# Patient Record
Sex: Female | Born: 1985 | Race: White | Hispanic: No | Marital: Single | Smoking: Never smoker
Health system: Southern US, Community
[De-identification: ages and names within clinical notes are randomized; demographics above are authoritative.]

## PROBLEM LIST (undated history)

## (undated) DIAGNOSIS — U071 COVID-19: Secondary | ICD-10-CM

## (undated) DIAGNOSIS — F909 Attention-deficit hyperactivity disorder, unspecified type: Secondary | ICD-10-CM

## (undated) DIAGNOSIS — R519 Headache, unspecified: Secondary | ICD-10-CM

## (undated) DIAGNOSIS — F419 Anxiety disorder, unspecified: Secondary | ICD-10-CM

## (undated) DIAGNOSIS — Z87442 Personal history of urinary calculi: Secondary | ICD-10-CM

## (undated) DIAGNOSIS — I10 Essential (primary) hypertension: Secondary | ICD-10-CM

## (undated) DIAGNOSIS — E063 Autoimmune thyroiditis: Secondary | ICD-10-CM

## (undated) DIAGNOSIS — F32A Depression, unspecified: Secondary | ICD-10-CM

## (undated) HISTORY — PX: BUNIONECTOMY: SHX129

## (undated) HISTORY — PX: OTHER SURGICAL HISTORY: SHX169

## (undated) HISTORY — PX: LAPAROTOMY: SHX154

## (undated) HISTORY — PX: OOPHORECTOMY: SHX86

---

## 2005-01-15 ENCOUNTER — Emergency Department (HOSPITAL_COMMUNITY): Admission: EM | Admit: 2005-01-15 | Discharge: 2005-01-16 | Payer: Self-pay | Admitting: Emergency Medicine

## 2006-03-16 ENCOUNTER — Ambulatory Visit: Payer: Self-pay | Admitting: Hematology & Oncology

## 2006-03-31 LAB — CBC WITH DIFFERENTIAL/PLATELET
Basophils Absolute: 0 10*3/uL (ref 0.0–0.1)
Eosinophils Absolute: 0.1 10*3/uL (ref 0.0–0.5)
HGB: 12.6 g/dL (ref 11.6–15.9)
MCV: 86.8 fL (ref 81.0–101.0)
MONO#: 0.4 10*3/uL (ref 0.1–0.9)
MONO%: 8 % (ref 0.0–13.0)
NEUT#: 1.7 10*3/uL (ref 1.5–6.5)
RBC: 4.22 10*6/uL (ref 3.70–5.32)
RDW: 13.8 % (ref 11.3–14.5)
WBC: 4.6 10*3/uL (ref 3.9–10.0)
lymph#: 2.5 10*3/uL (ref 0.9–3.3)

## 2006-03-31 LAB — CHCC SMEAR

## 2006-03-31 LAB — IVY BLEEDING TIME: Bleeding Time: 9 Minutes (ref 2.0–8.0)

## 2006-04-03 LAB — VON WILLEBRAND PANEL
Factor-VIII Activity: 68 % — ABNORMAL LOW (ref 75–150)
Ristocetin-Cofactor: 49 % — ABNORMAL LOW (ref 50–150)

## 2006-04-03 LAB — APTT: aPTT: 30 seconds (ref 24–37)

## 2006-04-04 ENCOUNTER — Encounter: Admission: RE | Admit: 2006-04-04 | Discharge: 2006-04-04 | Payer: Self-pay | Admitting: Hematology & Oncology

## 2006-04-26 LAB — IVY BLEEDING TIME: Bleeding Time: 8.5 Minutes — ABNORMAL HIGH (ref 2.0–8.0)

## 2006-05-03 LAB — PROTHROMBIN TIME
INR: 1 (ref 0.0–1.5)
Prothrombin Time: 12.8 seconds (ref 11.6–15.2)

## 2006-05-03 LAB — VON WILLEBRAND FACTOR MULTIMER

## 2010-07-11 ENCOUNTER — Emergency Department (HOSPITAL_COMMUNITY): Admission: EM | Admit: 2010-07-11 | Discharge: 2010-07-11 | Payer: Self-pay | Admitting: Emergency Medicine

## 2010-07-12 ENCOUNTER — Inpatient Hospital Stay (HOSPITAL_COMMUNITY): Admission: AD | Admit: 2010-07-12 | Discharge: 2010-07-12 | Payer: Self-pay | Admitting: Family Medicine

## 2010-07-12 ENCOUNTER — Ambulatory Visit: Payer: Self-pay | Admitting: Obstetrics and Gynecology

## 2010-12-01 LAB — DIFFERENTIAL
Basophils Absolute: 0 10*3/uL (ref 0.0–0.1)
Basophils Absolute: 0 10*3/uL (ref 0.0–0.1)
Basophils Relative: 0 % (ref 0–1)
Eosinophils Absolute: 0 10*3/uL (ref 0.0–0.7)
Eosinophils Absolute: 0.1 10*3/uL (ref 0.0–0.7)
Eosinophils Relative: 1 % (ref 0–5)
Eosinophils Relative: 3 % (ref 0–5)
Lymphocytes Relative: 53 % — ABNORMAL HIGH (ref 12–46)
Lymphs Abs: 2.3 10*3/uL (ref 0.7–4.0)
Monocytes Absolute: 0.3 10*3/uL (ref 0.1–1.0)
Monocytes Relative: 7 % (ref 3–12)
Neutrophils Relative %: 35 % — ABNORMAL LOW (ref 43–77)

## 2010-12-01 LAB — COMPREHENSIVE METABOLIC PANEL
ALT: 14 U/L (ref 0–35)
AST: 19 U/L (ref 0–37)
Albumin: 4.3 g/dL (ref 3.5–5.2)
Alkaline Phosphatase: 54 U/L (ref 39–117)
BUN: 10 mg/dL (ref 6–23)
Chloride: 109 mEq/L (ref 96–112)
GFR calc Af Amer: >60 mL/min (ref >60)
Potassium: 4.2 mEq/L (ref 3.5–5.1)
Sodium: 142 mEq/L (ref 135–145)
Total Bilirubin: 0.8 mg/dL (ref 0.3–1.2)
Total Protein: 6.9 g/dL (ref 6.0–8.3)

## 2010-12-01 LAB — URINALYSIS, ROUTINE W REFLEX MICROSCOPIC
Glucose, UA: NEGATIVE mg/dL
Ketones, ur: NEGATIVE mg/dL
Leukocytes, UA: NEGATIVE
Nitrite: NEGATIVE
Protein, ur: NEGATIVE mg/dL

## 2010-12-01 LAB — CBC
MCV: 91.1 fL (ref 78.0–100.0)
MCV: 92.1 fL (ref 78.0–100.0)
Platelets: 187 10*3/uL (ref 150–400)
Platelets: 209 10*3/uL (ref 150–400)
RBC: 4.07 MIL/uL (ref 3.87–5.11)
RBC: 4.23 MIL/uL (ref 3.87–5.11)
RDW: 12.6 % (ref 11.5–15.5)
WBC: 4.4 10*3/uL (ref 4.0–10.5)
WBC: 4.7 10*3/uL (ref 4.0–10.5)

## 2010-12-01 LAB — URINE CULTURE
Colony Count: NO GROWTH
Culture  Setup Time: 201110250158
Culture: NO GROWTH

## 2010-12-01 LAB — WET PREP, GENITAL
Trich, Wet Prep: NONE SEEN
Yeast Wet Prep HPF POC: NONE SEEN

## 2010-12-01 LAB — URINE MICROSCOPIC-ADD ON

## 2011-07-05 ENCOUNTER — Other Ambulatory Visit (HOSPITAL_COMMUNITY): Payer: Self-pay | Admitting: Endocrinology

## 2011-07-05 DIAGNOSIS — E0590 Thyrotoxicosis, unspecified without thyrotoxic crisis or storm: Secondary | ICD-10-CM

## 2011-07-20 ENCOUNTER — Encounter (HOSPITAL_COMMUNITY)
Admission: RE | Admit: 2011-07-20 | Discharge: 2011-07-20 | Disposition: A | Discharge status: Home / Self Care | Payer: 59 | Source: Ambulatory Visit | Attending: Endocrinology | Admitting: Endocrinology

## 2011-07-20 DIAGNOSIS — E0590 Thyrotoxicosis, unspecified without thyrotoxic crisis or storm: Secondary | ICD-10-CM | POA: Insufficient documentation

## 2011-07-21 ENCOUNTER — Encounter (HOSPITAL_COMMUNITY): Payer: Self-pay

## 2011-07-21 ENCOUNTER — Encounter (HOSPITAL_COMMUNITY)
Admission: RE | Admit: 2011-07-21 | Discharge: 2011-07-21 | Disposition: A | Discharge status: Home / Self Care | Payer: 59 | Source: Ambulatory Visit | Attending: Endocrinology | Admitting: Endocrinology

## 2011-07-21 MED ORDER — SODIUM IODIDE I 131 CAPSULE
6.5000 | Freq: Once | INTRAVENOUS | Status: AC | PRN
  Administered 2011-07-20: 6.5 via ORAL

## 2011-07-21 MED ORDER — SODIUM PERTECHNETATE TC 99M INJECTION
10.0000 | Freq: Once | INTRAVENOUS | Status: AC | PRN
  Administered 2011-07-21: 10 via INTRAVENOUS

## 2015-01-08 DIAGNOSIS — F331 Major depressive disorder, recurrent, moderate: Secondary | ICD-10-CM | POA: Insufficient documentation

## 2015-11-18 DIAGNOSIS — N80129 Deep endometriosis of ovary, unspecified ovary: Secondary | ICD-10-CM | POA: Insufficient documentation

## 2016-03-04 DIAGNOSIS — E039 Hypothyroidism, unspecified: Secondary | ICD-10-CM | POA: Insufficient documentation

## 2018-06-13 DIAGNOSIS — F909 Attention-deficit hyperactivity disorder, unspecified type: Secondary | ICD-10-CM | POA: Insufficient documentation

## 2018-08-09 DIAGNOSIS — F419 Anxiety disorder, unspecified: Secondary | ICD-10-CM | POA: Insufficient documentation

## 2019-06-07 DIAGNOSIS — E063 Autoimmune thyroiditis: Secondary | ICD-10-CM | POA: Insufficient documentation

## 2020-10-16 DIAGNOSIS — G43009 Migraine without aura, not intractable, without status migrainosus: Secondary | ICD-10-CM | POA: Insufficient documentation

## 2021-04-30 DIAGNOSIS — I10 Essential (primary) hypertension: Secondary | ICD-10-CM | POA: Insufficient documentation

## 2021-06-07 ENCOUNTER — Encounter (HOSPITAL_BASED_OUTPATIENT_CLINIC_OR_DEPARTMENT_OTHER): Payer: Self-pay | Admitting: Obstetrics and Gynecology

## 2021-06-07 ENCOUNTER — Other Ambulatory Visit: Payer: Self-pay

## 2021-06-07 NOTE — Progress Notes (Signed)
Spoke w/ via phone for pre-op interview---pt  Lab needs dos----   T&S,I stat, UPT, EKG            Lab results------ COVID test -----patient states asymptomatic no test needed Arrive at -------noon 06/15/2021 NPO after MN NO Solid Food.  Clear liquids from MN until--- Med rec completed Medications to take morning of surgery -----adderall and atarax, and propranolol  Diabetic medication -----n/a Patient instructed no nail polish to be worn day of surgery Patient instructed to bring photo id and insurance card day of surgery Patient aware to have Driver (ride ) / caregiver friend amy Doreene Adas  for 24 hours after surgery  Patient Special Instructions ----none - Pre-Op special Istructions -----none  Patient verbalized understanding of instructions that were given at this phone interview. Patient denies shortness of breath, chest pain, fever, cough at this phone interview.

## 2021-06-14 NOTE — H&P (Signed)
Brandy Hodges is an 35 y.o. female. P1 with suspected endometrial polyps found on SIS perfomred for prolonged spotting with cycles.   05/03/21 SIS: anteverted 6.9cm uterus, EMS 2.23mm, with saline infusion two echogenic foci at fundus that could not be flushed with saline, suspect endometrial polyps. Normal right ovary. Left ovary absent.  On OCPS  Patient's last menstrual period was 05/28/2021 (exact date).    Past Medical History:  Diagnosis Date   ADHD (attention deficit hyperactivity disorder)    takes adderall as needed . 06/07/2021   Anxiety    takes atarax for anxiety. 06/07/2021   COVID    Jan 2022 h/a, body aches, SOB, and chest pain for 3 days. 06/07/2021   Depression    takes  atarax for depression and cymbalta. 06/07/2021   Hashimoto's disease    per pt doesnt take anything for thryoid 06/07/2021   Headache    History of kidney stones    Hypertension     Past Surgical History:  Procedure Laterality Date   BUNIONECTOMY Left    2013   ehtro     LAPAROTOMY     2016   OOPHORECTOMY Left    2020    History reviewed. No pertinent family history.  Social History:  reports that she has never smoked. She has never used smokeless tobacco. She reports current alcohol use. She reports that she does not use drugs.  Allergies: No Known Allergies  No medications prior to admission.    Review of Systems  Constitutional:  Negative for fever.  HENT:  Negative for sore throat.   Eyes:  Negative for pain.  Respiratory:  Negative for shortness of breath.   Cardiovascular:  Negative for chest pain.  Gastrointestinal:  Negative for abdominal pain.  Genitourinary:  Negative for pelvic pain.  Musculoskeletal:  Negative for arthralgias.  Skin:  Negative for rash.  Neurological:  Negative for headaches.  Psychiatric/Behavioral:  Negative for suicidal ideas.    Height 5\' 9"  (1.753 m), weight 79.4 kg, last menstrual period 05/28/2021. Physical Exam  From 05/03/21 office exam   Constitutional General Appearance: healthy-appearing, well-nourished, well-developed  Psychiatric Orientation: to time, to place, to person Mood and Affect: active and alert, normal mood, normal affect  Abdomen Auscultation/Inspection/Palpation: normal bowel sounds, soft, non-distended, no tenderness, no hepatomegaly, no splenomegaly, no masses, no CVA tenderness Hernia: none palpated  Female Genitalia Vulva: no masses, no atrophy, no lesions Bladder/Urethra: normal meatus, no urethral discharge, no urethral mass, bladder non distended Vagina no tenderness, no erythema, no abnormal vaginal discharge, no vesicle(s) or ulcers, no cystocele, no rectocele Cervix: grossly normal, no discharge, no cervical motion tenderness Uterus: normal size, normal shape, midline, mobile, non-tender, no uterine prolapse Adnexa/Parametria: no parametrial tenderness, no parametrial mass, no adnexal tenderness, no ovarian mass Procedure Documentation   No results found for this or any previous visit (from the past 24 hour(s)).  No results found.  Assessment/Plan: 34Y P1 with abnormal uterine bleeding, suspected polyps on SIS, plan for hysteroscopy, polypectomy, dilation and curettage.   05/05/21 06/14/2021, 7:41 PM

## 2021-06-15 ENCOUNTER — Other Ambulatory Visit: Payer: Self-pay

## 2021-06-15 ENCOUNTER — Encounter (HOSPITAL_BASED_OUTPATIENT_CLINIC_OR_DEPARTMENT_OTHER): Payer: Self-pay | Admitting: Obstetrics and Gynecology

## 2021-06-15 ENCOUNTER — Ambulatory Visit (HOSPITAL_BASED_OUTPATIENT_CLINIC_OR_DEPARTMENT_OTHER): Payer: BC Managed Care – PPO | Admitting: Certified Registered"

## 2021-06-15 ENCOUNTER — Encounter (HOSPITAL_BASED_OUTPATIENT_CLINIC_OR_DEPARTMENT_OTHER)
Admission: RE | Disposition: A | Discharge status: Home / Self Care | Payer: Self-pay | Source: Home / Self Care | Attending: Obstetrics and Gynecology

## 2021-06-15 ENCOUNTER — Ambulatory Visit (HOSPITAL_BASED_OUTPATIENT_CLINIC_OR_DEPARTMENT_OTHER)
Admission: RE | Admit: 2021-06-15 | Discharge: 2021-06-15 | Disposition: A | Discharge status: Home / Self Care | Payer: BC Managed Care – PPO | Attending: Obstetrics and Gynecology | Admitting: Obstetrics and Gynecology

## 2021-06-15 DIAGNOSIS — Z90721 Acquired absence of ovaries, unilateral: Secondary | ICD-10-CM | POA: Diagnosis not present

## 2021-06-15 DIAGNOSIS — I10 Essential (primary) hypertension: Secondary | ICD-10-CM | POA: Diagnosis not present

## 2021-06-15 DIAGNOSIS — N840 Polyp of corpus uteri: Secondary | ICD-10-CM | POA: Insufficient documentation

## 2021-06-15 DIAGNOSIS — Z8616 Personal history of COVID-19: Secondary | ICD-10-CM | POA: Insufficient documentation

## 2021-06-15 DIAGNOSIS — N939 Abnormal uterine and vaginal bleeding, unspecified: Secondary | ICD-10-CM | POA: Diagnosis present

## 2021-06-15 HISTORY — DX: Autoimmune thyroiditis: E06.3

## 2021-06-15 HISTORY — DX: Essential (primary) hypertension: I10

## 2021-06-15 HISTORY — DX: Personal history of urinary calculi: Z87.442

## 2021-06-15 HISTORY — DX: COVID-19: U07.1

## 2021-06-15 HISTORY — DX: Depression, unspecified: F32.A

## 2021-06-15 HISTORY — DX: Anxiety disorder, unspecified: F41.9

## 2021-06-15 HISTORY — PX: DILATATION & CURETTAGE/HYSTEROSCOPY WITH MYOSURE: SHX6511

## 2021-06-15 HISTORY — DX: Headache, unspecified: R51.9

## 2021-06-15 HISTORY — DX: Attention-deficit hyperactivity disorder, unspecified type: F90.9

## 2021-06-15 LAB — POCT I-STAT, CHEM 8
BUN: 11 mg/dL (ref 6–20)
Calcium, Ion: 1.22 mmol/L (ref 1.15–1.40)
Chloride: 104 mmol/L (ref 98–111)
Creatinine, Ser: 0.7 mg/dL (ref 0.44–1.00)
Glucose, Bld: 80 mg/dL (ref 70–99)
HCT: 39 % (ref 36.0–46.0)
Hemoglobin: 13.3 g/dL (ref 12.0–15.0)
Potassium: 3.9 mmol/L (ref 3.5–5.1)
Sodium: 139 mmol/L (ref 135–145)
TCO2: 24 mmol/L (ref 22–32)

## 2021-06-15 LAB — ABO/RH: ABO/RH(D): O POS

## 2021-06-15 LAB — TYPE AND SCREEN
ABO/RH(D): O POS
Antibody Screen: NEGATIVE

## 2021-06-15 LAB — POCT PREGNANCY, URINE: Preg Test, Ur: NEGATIVE

## 2021-06-15 SURGERY — DILATATION & CURETTAGE/HYSTEROSCOPY WITH MYOSURE
Anesthesia: General

## 2021-06-15 MED ORDER — MIDAZOLAM HCL 2 MG/2ML IJ SOLN
INTRAMUSCULAR | Status: AC
  Filled 2021-06-15: qty 2

## 2021-06-15 MED ORDER — KETOROLAC TROMETHAMINE 30 MG/ML IJ SOLN
INTRAMUSCULAR | Status: AC
  Filled 2021-06-15: qty 1

## 2021-06-15 MED ORDER — KETOROLAC TROMETHAMINE 15 MG/ML IJ SOLN
15.0000 mg | INTRAMUSCULAR | Status: AC
  Administered 2021-06-15: 15 mg via INTRAVENOUS

## 2021-06-15 MED ORDER — PROPOFOL 10 MG/ML IV BOLUS
INTRAVENOUS | Status: DC | PRN
  Administered 2021-06-15: 200 mg via INTRAVENOUS

## 2021-06-15 MED ORDER — PROMETHAZINE HCL 25 MG/ML IJ SOLN: 6.2500 mg | INTRAMUSCULAR | Status: DC | PRN

## 2021-06-15 MED ORDER — FENTANYL CITRATE (PF) 100 MCG/2ML IJ SOLN
INTRAMUSCULAR | Status: DC | PRN
  Administered 2021-06-15 (×2): 25 ug via INTRAVENOUS
  Administered 2021-06-15: 50 ug via INTRAVENOUS

## 2021-06-15 MED ORDER — IBUPROFEN 200 MG PO TABS: 800.0000 mg | ORAL_TABLET | Freq: Four times a day (QID) | ORAL | Status: DC | PRN

## 2021-06-15 MED ORDER — FENTANYL CITRATE (PF) 100 MCG/2ML IJ SOLN: 25.0000 ug | INTRAMUSCULAR | Status: DC | PRN

## 2021-06-15 MED ORDER — ACETAMINOPHEN 500 MG PO TABS
ORAL_TABLET | ORAL | Status: AC
  Filled 2021-06-15: qty 2

## 2021-06-15 MED ORDER — LIDOCAINE HCL (PF) 2 % IJ SOLN
INTRAMUSCULAR | Status: AC
  Filled 2021-06-15: qty 5

## 2021-06-15 MED ORDER — DEXAMETHASONE SODIUM PHOSPHATE 10 MG/ML IJ SOLN
INTRAMUSCULAR | Status: AC
  Filled 2021-06-15: qty 1

## 2021-06-15 MED ORDER — LIDOCAINE HCL 1 % IJ SOLN
INTRAMUSCULAR | Status: DC | PRN
  Administered 2021-06-15: 10 mL

## 2021-06-15 MED ORDER — ONDANSETRON HCL 4 MG/2ML IJ SOLN
INTRAMUSCULAR | Status: AC
  Filled 2021-06-15: qty 2

## 2021-06-15 MED ORDER — LIDOCAINE 2% (20 MG/ML) 5 ML SYRINGE
INTRAMUSCULAR | Status: DC | PRN
  Administered 2021-06-15: 60 mg via INTRAVENOUS

## 2021-06-15 MED ORDER — OXYCODONE HCL 5 MG PO TABS
5.0000 mg | ORAL_TABLET | Freq: Once | ORAL | Status: DC | PRN
Start: 2021-06-15 — End: 2021-06-15

## 2021-06-15 MED ORDER — FENTANYL CITRATE (PF) 100 MCG/2ML IJ SOLN
INTRAMUSCULAR | Status: AC
  Filled 2021-06-15: qty 2

## 2021-06-15 MED ORDER — PROPOFOL 10 MG/ML IV BOLUS
INTRAVENOUS | Status: AC
  Filled 2021-06-15: qty 20

## 2021-06-15 MED ORDER — LACTATED RINGERS IV SOLN: INTRAVENOUS | Status: DC

## 2021-06-15 MED ORDER — ACETAMINOPHEN 500 MG PO TABS
1000.0000 mg | ORAL_TABLET | ORAL | Status: AC
  Administered 2021-06-15: 1000 mg via ORAL

## 2021-06-15 MED ORDER — SODIUM CHLORIDE 0.9 % IR SOLN
Status: DC | PRN
  Administered 2021-06-15: 3000 mL

## 2021-06-15 MED ORDER — SILVER NITRATE-POT NITRATE 75-25 % EX MISC
CUTANEOUS | Status: DC | PRN
  Administered 2021-06-15: 2

## 2021-06-15 MED ORDER — DEXAMETHASONE SODIUM PHOSPHATE 10 MG/ML IJ SOLN
INTRAMUSCULAR | Status: DC | PRN
  Administered 2021-06-15 (×2): 5 mg via INTRAVENOUS

## 2021-06-15 MED ORDER — MIDAZOLAM HCL 2 MG/2ML IJ SOLN
INTRAMUSCULAR | Status: DC | PRN
Start: 2021-06-15 — End: 2021-06-15
  Administered 2021-06-15: 2 mg via INTRAVENOUS

## 2021-06-15 MED ORDER — OXYCODONE HCL 5 MG/5ML PO SOLN: 5.0000 mg | Freq: Once | ORAL | Status: DC | PRN

## 2021-06-15 MED ORDER — ONDANSETRON HCL 4 MG/2ML IJ SOLN
INTRAMUSCULAR | Status: DC | PRN
  Administered 2021-06-15: 4 mg via INTRAVENOUS

## 2021-06-15 SURGICAL SUPPLY — 17 items
DEVICE MYOSURE LITE (MISCELLANEOUS) ×1 IMPLANT
GAUZE 4X4 16PLY ~~LOC~~+RFID DBL (SPONGE) ×2 IMPLANT
GLOVE SURG ENC MOIS LTX SZ6 (GLOVE) ×2 IMPLANT
GLOVE SURG POLYISO LF SZ6 (GLOVE) ×1 IMPLANT
GLOVE SURG UNDER POLY LF SZ6 (GLOVE) ×1 IMPLANT
GLOVE SURG UNDER POLY LF SZ6.5 (GLOVE) ×2 IMPLANT
GLOVE SURG UNDER POLY LF SZ7 (GLOVE) ×1 IMPLANT
GOWN STRL REUS W/ TWL LRG LVL3 (GOWN DISPOSABLE) ×1 IMPLANT
GOWN STRL REUS W/TWL LRG LVL3 (GOWN DISPOSABLE) ×3 IMPLANT
IV NS IRRIG 3000ML ARTHROMATIC (IV SOLUTION) ×1 IMPLANT
KIT PROCEDURE FLUENT (KITS) ×2 IMPLANT
KIT TURNOVER CYSTO (KITS) ×2 IMPLANT
PACK VAGINAL MINOR WOMEN LF (CUSTOM PROCEDURE TRAY) ×2 IMPLANT
PAD OB MATERNITY 4.3X12.25 (PERSONAL CARE ITEMS) ×2 IMPLANT
SEAL ROD LENS SCOPE MYOSURE (ABLATOR) ×2 IMPLANT
TOWEL OR 17X26 10 PK STRL BLUE (TOWEL DISPOSABLE) ×2 IMPLANT
UNDERPAD 30X36 HEAVY ABSORB (UNDERPADS AND DIAPERS) ×2 IMPLANT

## 2021-06-15 NOTE — Op Note (Addendum)
Operative Note   PATIENT:  Brandy Hodges  35 y.o. female   PRE-OPERATIVE DIAGNOSIS:  Abnormal uterine bleeding, suspected endometrial polyp   POST-OPERATIVE DIAGNOSIS:  Abnormal uterine bleeding, endometrial polyp   PROCEDURE:  Procedure(s): DILATATION & CURETTAGE/HYSTEROSCOPY WITH MYOSURE/ POLYPECTOMY/ EXAM UNDER ANESTHESIA (N/A)   SURGEON:  Surgeon(s) and Role:    * [name], MD - Primary   ANESTHESIA:   General with LMA   EBL:  10 mL    BLOOD ADMINISTERED:none   DRAINS: none    LOCAL MEDICATIONS USED:  LIDOCAINE, 10 mL   SPECIMEN:  Source of Specimen:  endometrial polyp, endometrial curettings   DISPOSITION OF SPECIMEN:  PATHOLOGY   COUNTS:  YES   PLAN OF CARE: Discharge to home after PACU   PATIENT DISPOSITION:  PACU - hemodynamically stable.  FINDINGS: anteverted uterus sounded to 8cm. On hysterscopic view, bilateral ostia visualized. Polypoid tissue noted with stalk arising from lower/mid posterior uterine wall. Otherwise thin appearing endometrium. Fluid deficit 49mL.   PROCEDURE IN DETAIL:  The patient was appropriately consented and taken to the operating room where anesthesia was administered without difficulty. Thromboguards were placed and connected. She was placed in the dorsal lithotomy position in stirrups. She was examined under anesthesia and found to have an anteverted uterus. The patient was then prepped and draped in normal sterile fashion. A speculum was inserted into the vagina. A single-tooth tenaculum was used to grasp the anterior lip of the cervix. A paracervical block was obtained by injecting 5mL of 1% lidocaine at the 4 and 8 o'clock positions of the cervicovaginal junction. The cervical os was sequentially dilated using Pratt dilators to accommodate the hysteroscope. The hysteroscope was introduced under direct visualization and the uterus was distended with normal saline. Bilateral ostia were visualized. Findings as noted above. The  Myosure Lite was used to resect the polyp. Hemostasis was noted in the uterine cavity. The hysteroscope was removed. A sharp curette was inserted and endometrial curettings were collected. The tenaculum was removed from the cervix and hemostasis was achieved at the puncture sites with pressure and silver nitrate. The patient tolerated the procedure well. The instrument and sponge counts were correct times two. The patient was awakened from anesthesia and taken to the recovery room in stable condition.  Derl Barrow, MD 06/15/21 2:24 PM

## 2021-06-15 NOTE — Anesthesia Preprocedure Evaluation (Addendum)
Anesthesia Evaluation  Patient identified by MRN, date of birth, ID band Patient awake    Reviewed: Allergy & Precautions, NPO status , Patient's Chart, lab work & pertinent test results, reviewed documented beta blocker date and time   History of Anesthesia Complications Negative for: history of anesthetic complications  Airway Mallampati: III  TM Distance: >3 FB Neck ROM: Full    Dental  (+) Dental Advisory Given, Teeth Intact   Pulmonary neg pulmonary ROS,    Pulmonary exam normal        Cardiovascular hypertension, Pt. on home beta blockers and Pt. on medications Normal cardiovascular exam     Neuro/Psych  Headaches, PSYCHIATRIC DISORDERS Anxiety Depression    GI/Hepatic negative GI ROS, Neg liver ROS,   Endo/Other   Hx Hashimoto's thyroiditis    Renal/GU negative Renal ROS     Musculoskeletal negative musculoskeletal ROS (+)   Abdominal   Peds  (+) ADHD Hematology negative hematology ROS (+)   Anesthesia Other Findings   Reproductive/Obstetrics                            Anesthesia Physical Anesthesia Plan  ASA: 2  Anesthesia Plan: General   Post-op Pain Management:    Induction: Intravenous  PONV Risk Score and Plan: 3 and Treatment may vary due to age or medical condition, Ondansetron, Dexamethasone and Midazolam  Airway Management Planned: LMA  Additional Equipment: None  Intra-op Plan:   Post-operative Plan: Extubation in OR  Informed Consent: I have reviewed the patients History and Physical, chart, labs and discussed the procedure including the risks, benefits and alternatives for the proposed anesthesia with the patient or authorized representative who has indicated his/her understanding and acceptance.     Dental advisory given  Plan Discussed with: CRNA and Anesthesiologist  Anesthesia Plan Comments:        Anesthesia Quick Evaluation

## 2021-06-15 NOTE — Anesthesia Procedure Notes (Signed)
Procedure Name: LMA Insertion Date/Time: 06/15/2021 1:33 PM Performed by: Francie Massing, CRNA Pre-anesthesia Checklist: Patient identified, Emergency Drugs available, Suction available and Patient being monitored Patient Re-evaluated:Patient Re-evaluated prior to induction Oxygen Delivery Method: Circle system utilized Preoxygenation: Pre-oxygenation with 100% oxygen Induction Type: IV induction Ventilation: Mask ventilation without difficulty LMA: LMA inserted LMA Size: 4.0 Number of attempts: 1 Airway Equipment and Method: Bite block Placement Confirmation: positive ETCO2 Tube secured with: Tape Dental Injury: Teeth and Oropharynx as per pre-operative assessment

## 2021-06-15 NOTE — Interval H&P Note (Signed)
History and Physical Interval Note:  06/15/2021 1:27 PM  Brandy Hodges  has presented today for surgery, with the diagnosis of endometrial polyp.  The various methods of treatment have been discussed with the patient and family. After consideration of risks, benefits and other options for treatment, the patient has consented to  Procedure(s): DILATATION & CURETTAGE/HYSTEROSCOPY WITH MYOSURE/ POLYPECTOMY/ EXAM UNDER ANESTHESIA (N/A) as a surgical intervention.  The patient's history has been reviewed, patient examined, no change in status, stable for surgery.  I have reviewed the patient's chart and labs.  Questions were answered to the patient's satisfaction.     [name]

## 2021-06-15 NOTE — Transfer of Care (Signed)
Immediate Anesthesia Transfer of Care Note  Patient: Brandy Hodges  Procedure(s) Performed: Procedure(s) (LRB): DILATATION & CURETTAGE/HYSTEROSCOPY WITH MYOSURE/ POLYPECTOMY/ EXAM UNDER ANESTHESIA (N/A)  Patient Location: PACU  Anesthesia Type: General  Level of Consciousness: awake, oriented, sedated and patient cooperative  Airway & Oxygen Therapy: Patient Spontanous Breathing and Patient connected to face mask oxygen  Post-op Assessment: Report given to PACU RN and Post -op Vital signs reviewed and stable  Post vital signs: Reviewed and stable  Complications: No apparent anesthesia complications Last Vitals:  Vitals Value Taken Time  BP 131/78 06/15/21 1407  Temp    Pulse 79 06/15/21 1409  Resp 14 06/15/21 1409  SpO2 100 % 06/15/21 1409  Vitals shown include unvalidated device data.  Last Pain: There were no vitals filed for this visit.    Patients Stated Pain Goal: 4 (06/15/21 1221)  Complications: No notable events documented.

## 2021-06-15 NOTE — Discharge Instructions (Signed)
Next dose of Tylenol/acetaminophen at 6:30 PM today.   Post Anesthesia Home Care Instructions  Activity: Get plenty of rest for the remainder of the day. A responsible individual must stay with you for 24 hours following the procedure.  For the next 24 hours, DO NOT: -Drive a car -Advertising copywriter -Drink alcoholic beverages -Take any medication unless instructed by your physician -Make any legal decisions or sign important papers.  Meals: Start with liquid foods such as gelatin or soup. Progress to regular foods as tolerated. Avoid greasy, spicy, heavy foods. If nausea and/or vomiting occur, drink only clear liquids until the nausea and/or vomiting subsides. Call your physician if vomiting continues.  Special Instructions/Symptoms: Your throat may feel dry or sore from the anesthesia or the breathing tube placed in your throat during surgery. If this causes discomfort, gargle with warm salt water. The discomfort should disappear within 24 hours.

## 2021-06-15 NOTE — Anesthesia Postprocedure Evaluation (Signed)
Anesthesia Post Note  Patient: Brandy Hodges  Procedure(s) Performed: DILATATION & CURETTAGE/HYSTEROSCOPY WITH MYOSURE/ POLYPECTOMY/ EXAM UNDER ANESTHESIA     Patient location during evaluation: PACU Anesthesia Type: General Level of consciousness: awake and alert Pain management: pain level controlled Vital Signs Assessment: post-procedure vital signs reviewed and stable Respiratory status: spontaneous breathing, nonlabored ventilation and respiratory function stable Cardiovascular status: stable and blood pressure returned to baseline Anesthetic complications: no   No notable events documented.  Last Vitals:  Vitals:   06/15/21 1430 06/15/21 1441  BP: 121/80 122/63  Pulse: 71 77  Resp: 12 14  Temp:  37 C  SpO2: 100% 100%    Last Pain:  Vitals:   06/15/21 1441  PainSc: 4                  Beryle Lathe

## 2021-06-15 NOTE — Brief Op Note (Addendum)
06/15/2021  2:07 PM  PATIENT:  Orvan July  35 y.o. female  PRE-OPERATIVE DIAGNOSIS:  Abnormal uterine bleeding, suspected endometrial polyp  POST-OPERATIVE DIAGNOSIS:  Abnormal uterine bleeding, endometrial polyp  PROCEDURE:  Procedure(s): DILATATION & CURETTAGE/HYSTEROSCOPY WITH MYOSURE/ POLYPECTOMY/ EXAM UNDER ANESTHESIA (N/A)  SURGEON:  Surgeon(s) and Role:    * [name], MD - Primary  ANESTHESIA:   General with LMA  EBL:  10 mL   BLOOD ADMINISTERED:none  DRAINS: none   LOCAL MEDICATIONS USED:  LIDOCAINE, 10 mL  SPECIMEN:  Source of Specimen:  endometrial polyp, endometrial curettings  DISPOSITION OF SPECIMEN:  PATHOLOGY  COUNTS:  YES  TOURNIQUET:  * No tourniquets in log *  DICTATION: .Note written in EPIC  PLAN OF CARE: Discharge to home after PACU  PATIENT DISPOSITION:  PACU - hemodynamically stable.   Delay start of Pharmacological VTE agent (>24hrs) due to surgical blood loss or risk of bleeding: not applicable

## 2021-06-16 ENCOUNTER — Encounter (HOSPITAL_BASED_OUTPATIENT_CLINIC_OR_DEPARTMENT_OTHER): Payer: Self-pay | Admitting: Obstetrics and Gynecology

## 2021-06-16 LAB — SURGICAL PATHOLOGY

## 2022-01-06 ENCOUNTER — Other Ambulatory Visit: Payer: Self-pay | Admitting: Family Medicine

## 2022-01-06 DIAGNOSIS — N6341 Unspecified lump in right breast, subareolar: Secondary | ICD-10-CM

## 2022-01-07 ENCOUNTER — Other Ambulatory Visit: Payer: Self-pay | Admitting: Family Medicine

## 2022-01-07 ENCOUNTER — Ambulatory Visit
Admission: RE | Admit: 2022-01-07 | Discharge: 2022-01-07 | Disposition: A | Discharge status: Home / Self Care | Payer: BC Managed Care – PPO | Source: Ambulatory Visit | Attending: Family Medicine | Admitting: Family Medicine

## 2022-01-07 DIAGNOSIS — N6310 Unspecified lump in the right breast, unspecified quadrant: Secondary | ICD-10-CM

## 2022-01-07 DIAGNOSIS — N6341 Unspecified lump in right breast, subareolar: Secondary | ICD-10-CM

## 2022-07-12 ENCOUNTER — Inpatient Hospital Stay: Admission: RE | Admit: 2022-07-12 | Payer: BC Managed Care – PPO | Source: Ambulatory Visit

## 2023-02-12 IMAGING — MG DIGITAL DIAGNOSTIC BILAT W/ TOMO W/ CAD
6 of 10 series · 6 of 30 positions shown · non-contrast
Comparison: None.

CLINICAL DATA: Patient describes a palpable lump within the
periareolar RIGHT breast. This is patient's first mammogram.

EXAM:
DIGITAL DIAGNOSTIC BILATERAL MAMMOGRAM WITH TOMOSYNTHESIS AND CAD;
ULTRASOUND RIGHT BREAST LIMITED
TECHNIQUE: Bilateral digital diagnostic mammography and breast tomosynthesis
was performed. The images were evaluated with computer-aided
detection.; Targeted ultrasound examination of the right breast was
performed

[L MLO synth-2D]
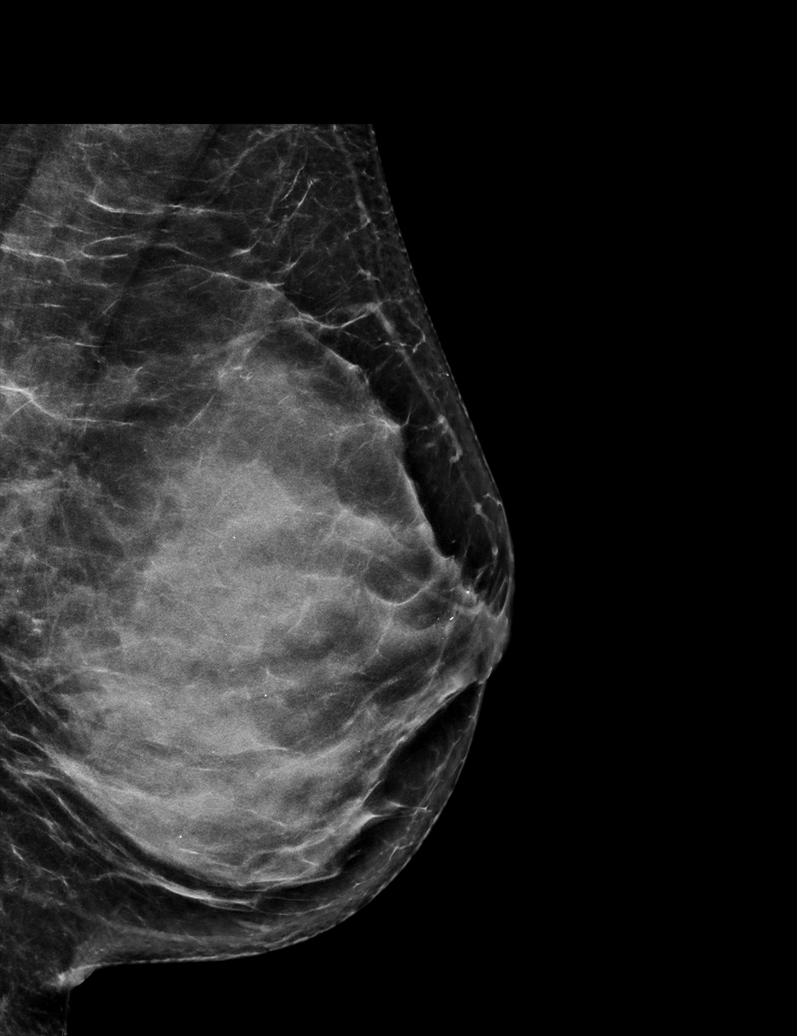

[L CC synth-2D]
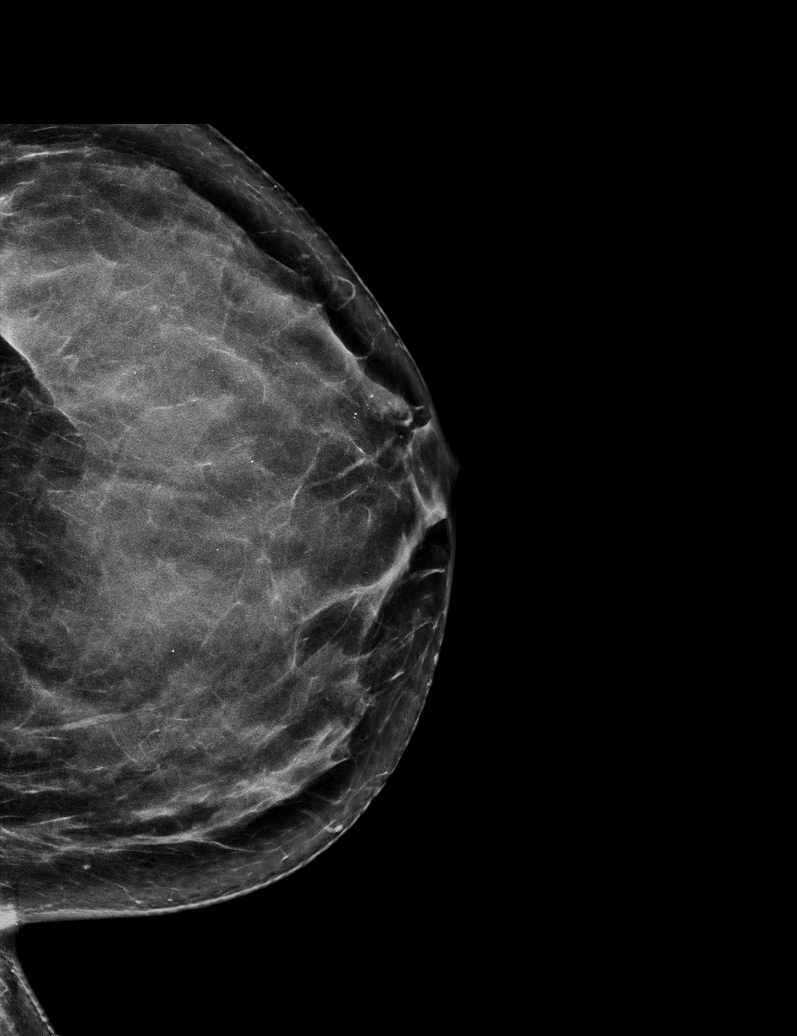

[R MLO synth-2D]
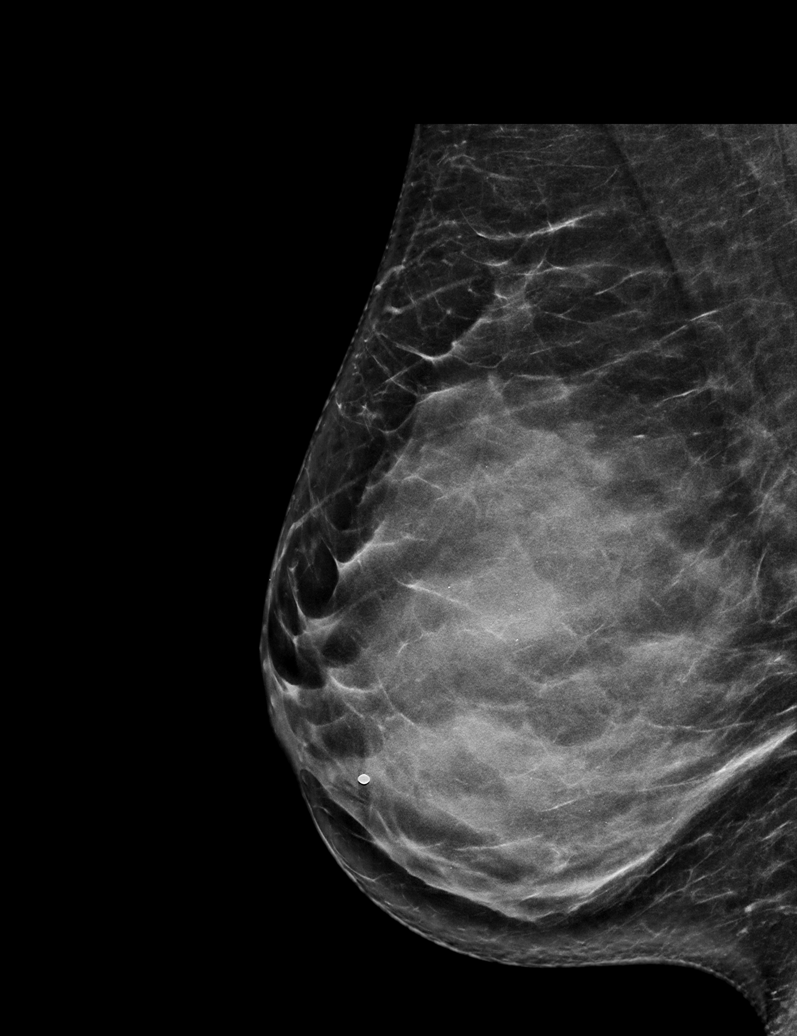

[R TAN synth-2D]
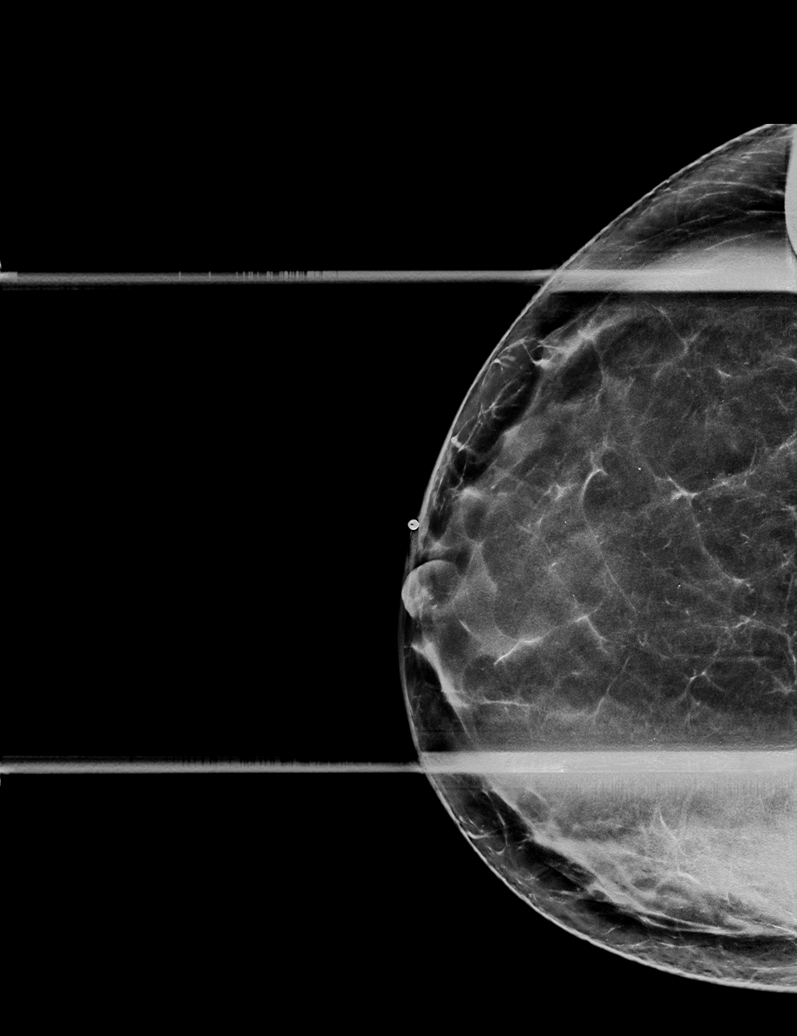

[R CC synth-2D]
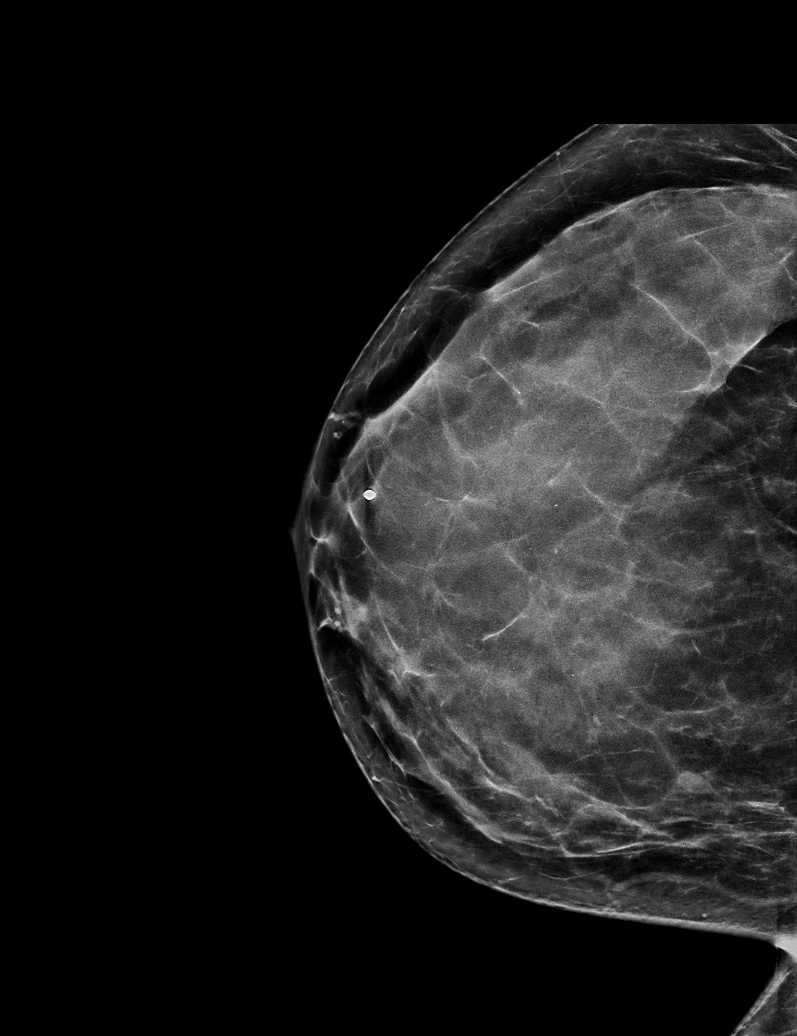

[R CC tomo · tomo slice 32/63.0]
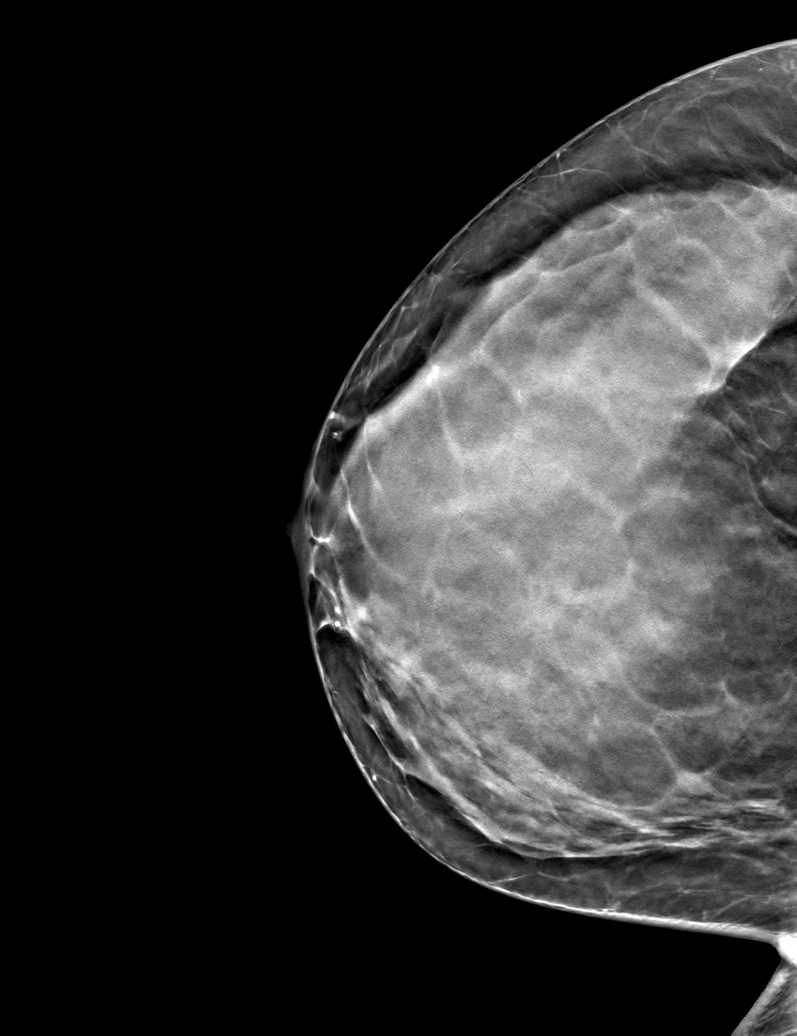

[6 of 30 positions shown; findings below may reference images not displayed]

ACR Breast Density Category d: The breast tissue is extremely dense,
which lowers the sensitivity of mammography.
FINDINGS: There are no dominant masses, suspicious calcifications or secondary
signs of malignancy identified within either breast. Specifically,
there is no mammographic abnormality within the periareolar RIGHT
breast corresponding to the area of clinical concern.

Targeted ultrasound is performed, showing an oval circumscribed
hypoechoic mass in the RIGHT breast at the 8 o'clock axis, 3 cm from
the nipple, measuring 8 mm, most suggestive of a benign complicated
cyst containing layering debris, corresponding to the palpable area
of concern. No suspicious solid or cystic mass is identified.
IMPRESSION: Probably benign complicated cyst, containing layering debris, within
the RIGHT breast at the 8 o'clock axis, 3 cm from the nipple,
measuring 8 mm, corresponding to the palpable area of concern.
Recommend follow-up RIGHT breast ultrasound in 6 months to ensure
stability.

RECOMMENDATION:
RIGHT breast ultrasound in 6 months.

I have discussed the findings and recommendations with the patient.
If applicable, a reminder letter will be sent to the patient
regarding the next appointment.

BI-RADS CATEGORY  3: Probably benign.

## 2023-02-12 IMAGING — US US BREAST*R* LIMITED INC AXILLA
1 series · 8 of 8 positions shown · non-contrast
Comparison: None.

CLINICAL DATA: Patient describes a palpable lump within the
periareolar RIGHT breast. This is patient's first mammogram.

EXAM:
DIGITAL DIAGNOSTIC BILATERAL MAMMOGRAM WITH TOMOSYNTHESIS AND CAD;
ULTRASOUND RIGHT BREAST LIMITED
TECHNIQUE: Bilateral digital diagnostic mammography and breast tomosynthesis
was performed. The images were evaluated with computer-aided
detection.; Targeted ultrasound examination of the right breast was
performed

[Series 1: us breast*right* limited inc axilla · 0.07mm/px · 8 of 8 slices shown]
[im 1/8]
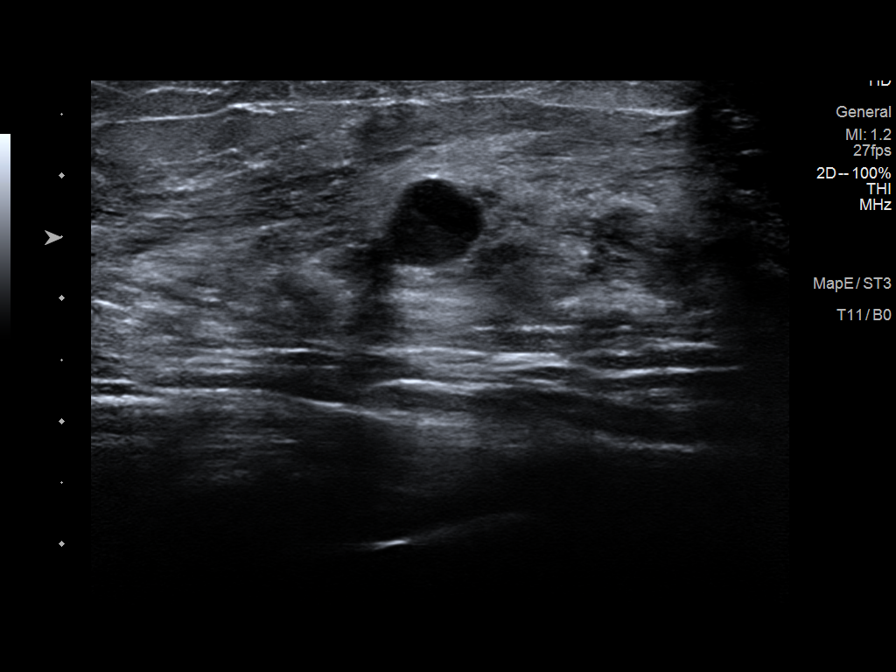
[im 2/8]
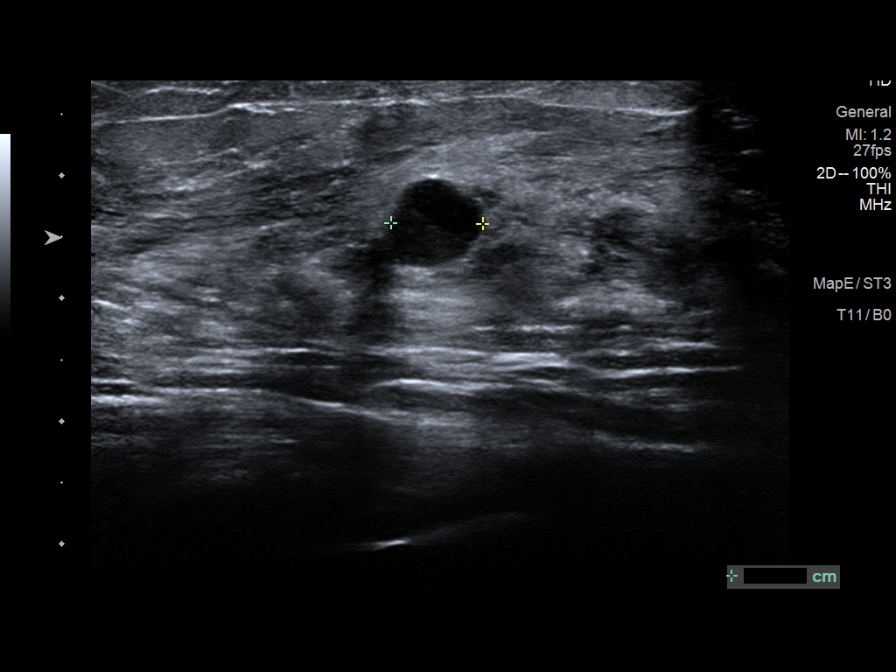
[im 3/8]
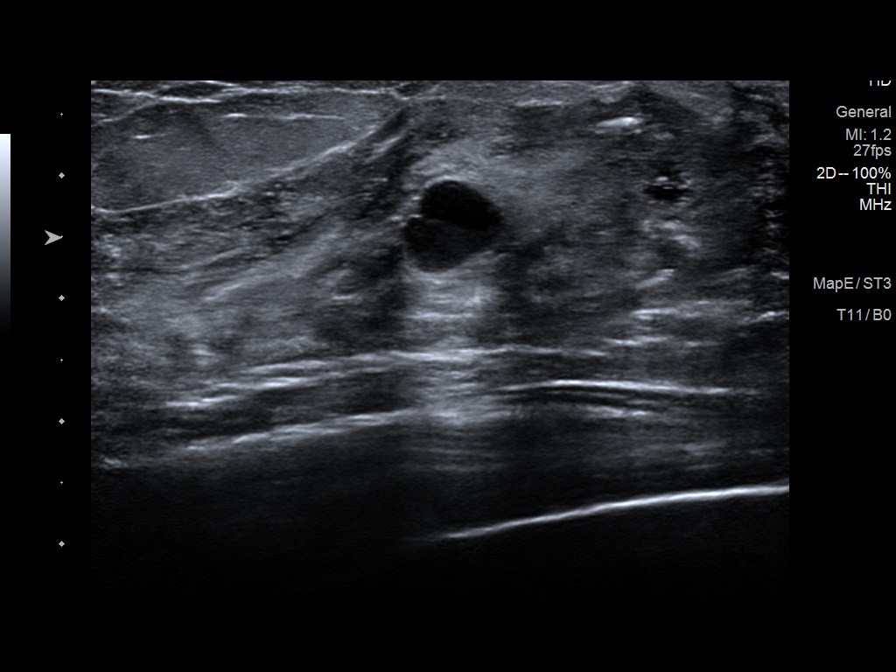
[im 4/8]
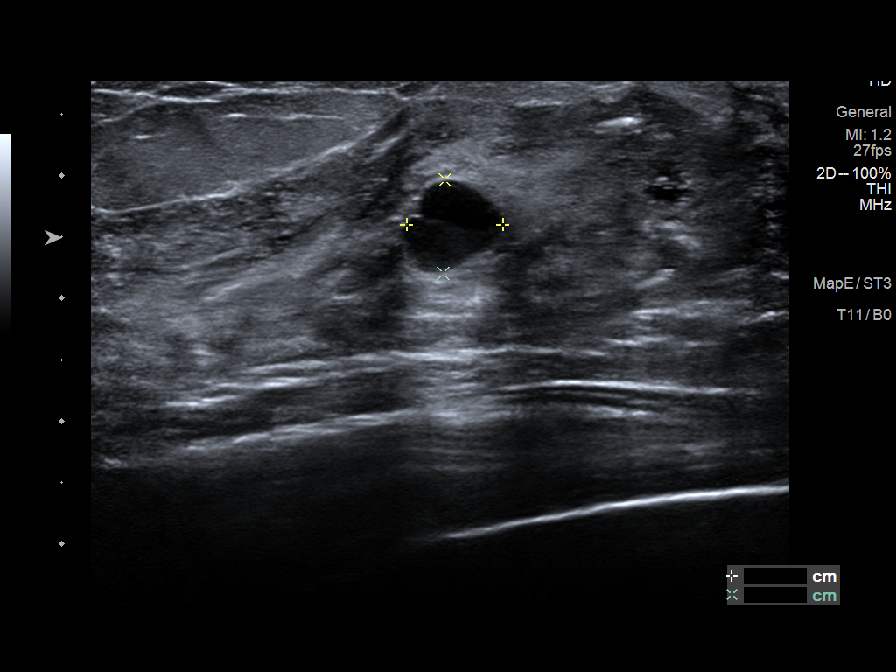
[im 5/8]
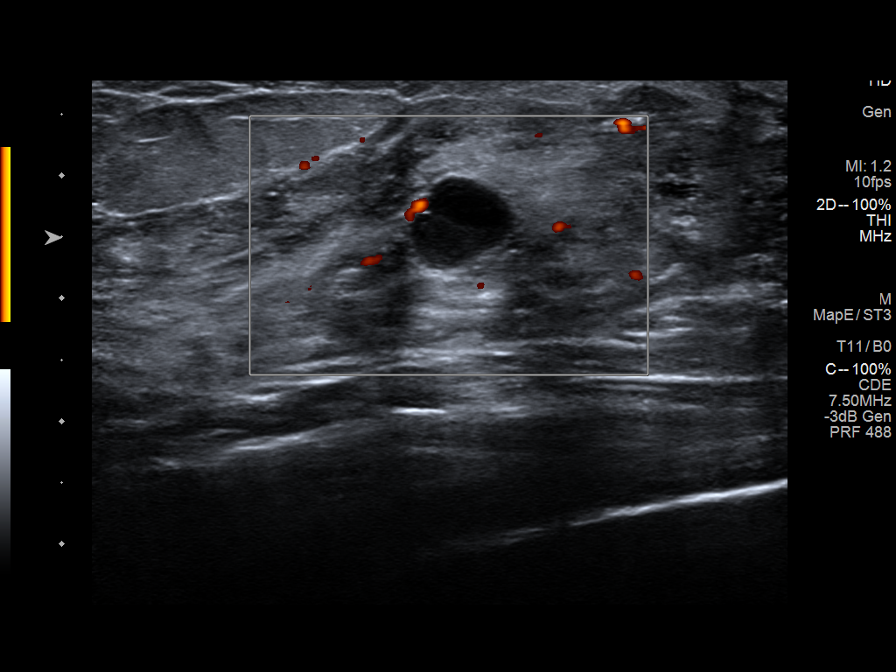
[im 6/8]
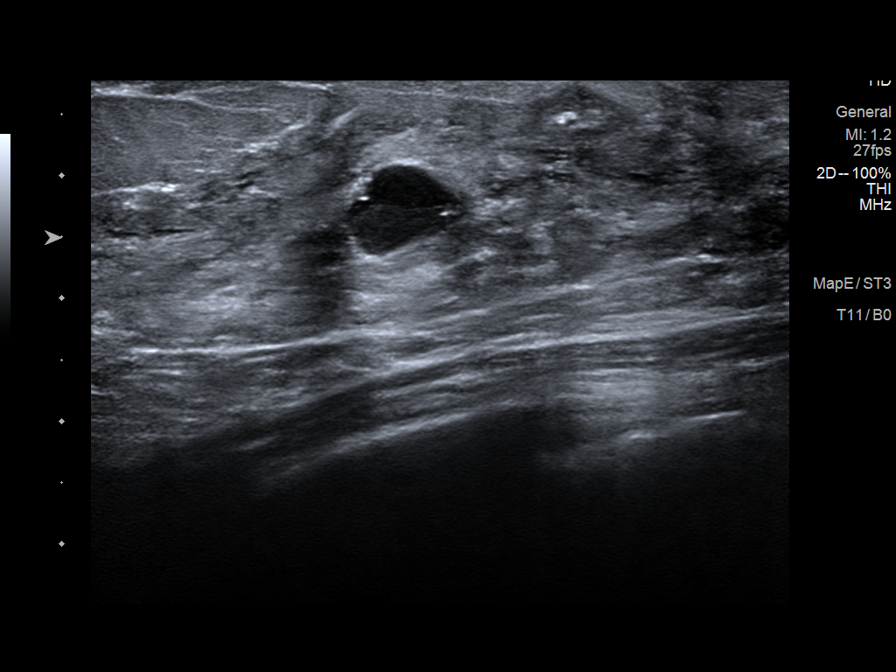
[im 7/8]
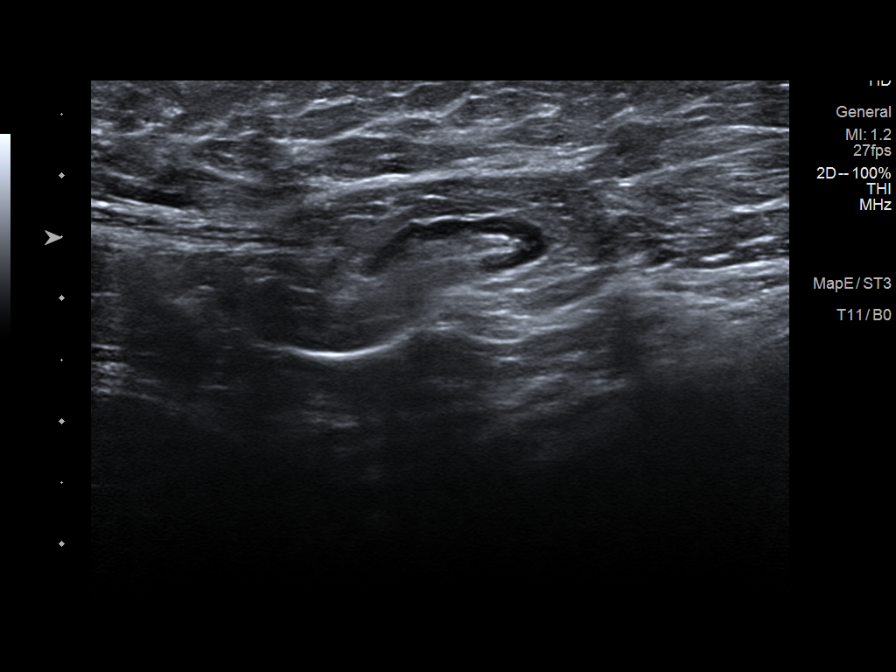
[im 8/8]
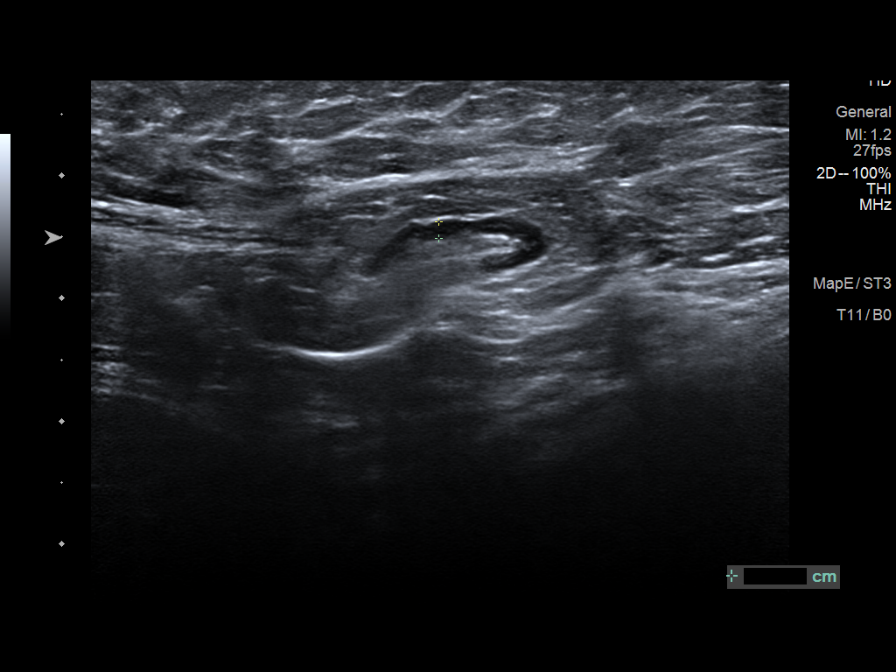

[8 of 8 positions shown; findings below may reference images not displayed]

ACR Breast Density Category d: The breast tissue is extremely dense,
which lowers the sensitivity of mammography.
FINDINGS: There are no dominant masses, suspicious calcifications or secondary
signs of malignancy identified within either breast. Specifically,
there is no mammographic abnormality within the periareolar RIGHT
breast corresponding to the area of clinical concern.

Targeted ultrasound is performed, showing an oval circumscribed
hypoechoic mass in the RIGHT breast at the 8 o'clock axis, 3 cm from
the nipple, measuring 8 mm, most suggestive of a benign complicated
cyst containing layering debris, corresponding to the palpable area
of concern. No suspicious solid or cystic mass is identified.
IMPRESSION: Probably benign complicated cyst, containing layering debris, within
the RIGHT breast at the 8 o'clock axis, 3 cm from the nipple,
measuring 8 mm, corresponding to the palpable area of concern.
Recommend follow-up RIGHT breast ultrasound in 6 months to ensure
stability.

RECOMMENDATION:
RIGHT breast ultrasound in 6 months.

I have discussed the findings and recommendations with the patient.
If applicable, a reminder letter will be sent to the patient
regarding the next appointment.

BI-RADS CATEGORY  3: Probably benign.

## 2023-04-27 ENCOUNTER — Ambulatory Visit (INDEPENDENT_AMBULATORY_CARE_PROVIDER_SITE_OTHER): Payer: BC Managed Care – PPO

## 2023-04-27 ENCOUNTER — Encounter: Payer: Self-pay | Admitting: Podiatry

## 2023-04-27 ENCOUNTER — Ambulatory Visit (INDEPENDENT_AMBULATORY_CARE_PROVIDER_SITE_OTHER): Payer: BC Managed Care – PPO | Admitting: Podiatry

## 2023-04-27 DIAGNOSIS — M25871 Other specified joint disorders, right ankle and foot: Secondary | ICD-10-CM

## 2023-04-27 DIAGNOSIS — M2011 Hallux valgus (acquired), right foot: Secondary | ICD-10-CM

## 2023-04-27 DIAGNOSIS — M2010 Hallux valgus (acquired), unspecified foot: Secondary | ICD-10-CM

## 2023-04-27 MED ORDER — METHYLPREDNISOLONE 4 MG PO TBPK: ORAL_TABLET | ORAL | 0 refills | Status: AC

## 2023-04-27 NOTE — Progress Notes (Signed)
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 female presents with the above complaint.   ROS: Denies fever chills nausea vomit muscle aches pains calf pain back pain chest pain shortness of breath.  Past Medical History:  Diagnosis Date   ADHD (attention deficit hyperactivity disorder)    takes adderall as needed . 06/07/2021   Anxiety    takes atarax for anxiety. 06/07/2021   COVID    Jan 2022 h/a, body aches, SOB, and chest pain for 3 days. 06/07/2021   Depression    takes  atarax for depression and cymbalta. 06/07/2021   Hashimoto's disease    per pt doesnt take anything for thryoid 06/07/2021   Headache    History of kidney stones    Hypertension    Past Surgical History:  Procedure Laterality Date   BUNIONECTOMY Left    2013   DILATATION & CURETTAGE/HYSTEROSCOPY WITH MYOSURE N/A 06/15/2021   Procedure: DILATATION & CURETTAGE/HYSTEROSCOPY WITH MYOSURE/ POLYPECTOMY/ EXAM UNDER ANESTHESIA;  Surgeon: [name], MD;  Location: [facility] ;  Service: Gynecology;  Laterality: N/A;   ehtro     LAPAROTOMY     2016   OOPHORECTOMY Left    2020    Current Outpatient Medications:    amphetamine-dextroamphetamine (ADDERALL) 30 MG tablet, Take 50 mg by mouth daily. Takes as needed 06/07/2021, Disp: , Rfl:    drospirenone-ethinyl estradiol (YAZ) 3-0.02 MG tablet, Take 1 tablet by mouth daily. Takes at night, Disp: , Rfl:    DULoxetine (CYMBALTA) 20 MG capsule, Take by mouth daily. Pt doesn't know doesn't know dose, Disp: , Rfl:    methylPREDNISolone (MEDROL DOSEPAK) 4 MG TBPK tablet, 6 day dose pack - take as directed, Disp: 21 tablet,  Rfl: 0  No Known Allergies Review of Systems Objective:  There were no vitals filed for this visit.  General: Well developed, nourished, in no acute distress, alert and oriented x3   Dermatological: Skin is warm, dry and supple bilateral. Nails x 10 are well maintained; remaining integument appears unremarkable at this time. There are no open sores, no preulcerative lesions, no rash or signs of infection present.  Vascular: Dorsalis Pedis artery and Posterior Tibial artery pedal pulses are 2/4 bilateral with immedate capillary fill time. Pedal hair growth present. No varicosities and no lower extremity edema present bilateral.   Neruologic: Grossly intact via light touch bilateral. Vibratory intact via tuning fork bilateral. Protective threshold with Semmes Wienstein monofilament intact to all pedal sites bilateral. Patellar and Achilles deep tendon reflexes 2+ bilateral. No Babinski or clonus noted bilateral.   Musculoskeletal: No gross boney pedal deformities bilateral. No pain, crepitus, or limitation noted with foot and ankle range of motion bilateral. Muscular strength 5/5 in all groups tested bilateral.  She has pain on palpation of the tibial sesamoid and its tibial sesamoidal suspensory ligament along the medial aspect of the first metatarsal.  She is unable to plantarflex the toe passively or actively.  Dorsiflexion is tender but not as painful.    Gait: Unassisted, Nonantalgic.    Radiographs:  Radiographs evaluated today demonstrating  osseously mature individual not demonstrating any type of fracture or contusion to the foot that I can see in the area of question.  Assessment & Plan:   Assessment: Sesamoiditis with suspensory ligament damage.  Plan: Start her on methylprednisolone to be taken for 6 days then discontinue she was placed in a Darco shoe that she will wear at all times.     Missy Baksh T. [place], [place]

## 2023-05-30 ENCOUNTER — Ambulatory Visit: Payer: BC Managed Care – PPO | Admitting: Podiatry

## 2023-08-31 ENCOUNTER — Emergency Department (HOSPITAL_BASED_OUTPATIENT_CLINIC_OR_DEPARTMENT_OTHER)
Admission: EM | Admit: 2023-08-31 | Discharge: 2023-08-31 | Disposition: A | Discharge status: Home / Self Care | Payer: BC Managed Care – PPO | Attending: Emergency Medicine | Admitting: Emergency Medicine

## 2023-08-31 DIAGNOSIS — R519 Headache, unspecified: Secondary | ICD-10-CM | POA: Insufficient documentation

## 2023-08-31 DIAGNOSIS — Z8739 Personal history of other diseases of the musculoskeletal system and connective tissue: Secondary | ICD-10-CM | POA: Insufficient documentation

## 2023-08-31 DIAGNOSIS — I10 Essential (primary) hypertension: Secondary | ICD-10-CM | POA: Diagnosis not present

## 2023-08-31 DIAGNOSIS — M329 Systemic lupus erythematosus, unspecified: Secondary | ICD-10-CM

## 2023-08-31 MED ORDER — PROPRANOLOL HCL 40 MG PO TABS: 40.0000 mg | ORAL_TABLET | Freq: Once | ORAL | Status: DC

## 2023-08-31 MED ORDER — PROPRANOLOL HCL 1 MG/ML IV SOLN
1.0000 mg | Freq: Once | INTRAVENOUS | Status: AC
  Administered 2023-08-31: 1 mg via INTRAVENOUS
  Filled 2023-08-31: qty 1

## 2023-08-31 MED ORDER — PROPRANOLOL HCL 40 MG PO TABS: 40.0000 mg | ORAL_TABLET | Freq: Two times a day (BID) | ORAL | 0 refills | Status: AC

## 2023-08-31 MED ORDER — KETOROLAC TROMETHAMINE 30 MG/ML IJ SOLN: 30.0000 mg | Freq: Once | INTRAMUSCULAR | Status: DC

## 2023-08-31 MED ORDER — KETOROLAC TROMETHAMINE 30 MG/ML IJ SOLN
30.0000 mg | Freq: Once | INTRAMUSCULAR | Status: AC
  Administered 2023-08-31: 30 mg via INTRAVENOUS
  Filled 2023-08-31: qty 1

## 2023-08-31 NOTE — ED Triage Notes (Addendum)
HA suspected to be related to BP. Has been taking propanolol and ran out several days. Worse with positional changes, denies vision changes. Recently had surge in stress at home. Has 'butterfly' rash on face.

## 2023-08-31 NOTE — Discharge Instructions (Signed)
You were seen in the emergency department for a headache.  We gave you a medication called toradol as well as an IV version of your home propranolol. I have refilled your propranolol prescription for home.  Please follow up with your doctor regarding further refills of your long term medications.  Continue to monitor how you're doing and return to the ER for new or worsening symptoms such as worsening headache, vision changes, lightheadedness, numbness or weakness of your extremities, difficulty swallowing or talking.

## 2023-08-31 NOTE — ED Provider Notes (Signed)
[facility] AT [facility] Provider Note   CSN: [id number] Arrival date & time: 08/31/23  2109     History  No chief complaint on file.   Brandy Hodges is a 37 y.o. female who presents the emergency department complaining of headache.  Patient normally on propranolol and has been out of this for about 4 to 5 days.  She started getting headache yesterday and felt it was related to stress as well as her blood pressure being elevated.  She has had significant increase of stress at home.  Does have history of lupus, and has broken out in a "butterfly rash" on her face, which normally indicates some kind of flare.  Her headache got worse with laying down.  Somewhat sensitive to the light, but no vision changes.  No weakness or numbness of the extremities.  She was somewhat nauseous and vomited twice, symptoms relieved after Zofran. Reports hx of severe headaches, this was is just more persistent than usual. Tried a cold medicine with tylenol in it without relief.   HPI     Home Medications Prior to Admission medications   Medication Sig Start Date End Date Taking? Authorizing Provider  propranolol (INDERAL) 40 MG tablet Take 1 tablet (40 mg total) by mouth 2 (two) times daily. 08/31/23  Yes Linzi Ohlinger T, PA-C  amphetamine-dextroamphetamine (ADDERALL) 30 MG tablet Take 50 mg by mouth daily. Takes as needed 06/07/2021    [provider]  drospirenone-ethinyl estradiol (YAZ) 3-0.02 MG tablet Take 1 tablet by mouth daily. Takes at night    [provider]  DULoxetine (CYMBALTA) 20 MG capsule Take by mouth daily. Pt doesn't know doesn't know dose    [provider]  methylPREDNISolone (MEDROL DOSEPAK) 4 MG TBPK tablet 6 day dose pack - take as directed 04/27/23   Ernestene Kiel T, DPM      Allergies    Patient has no known allergies.    Review of Systems   Review of Systems  Skin:  Positive for rash.  Neurological:  Positive for  headaches.  All other systems reviewed and are negative.   Physical Exam Updated Vital Signs BP 128/89   Pulse 77   Temp 98.2 F (36.8 C) (Oral)   Resp 18   SpO2 100%  Physical Exam Vitals and nursing note reviewed.  Constitutional:      Appearance: Normal appearance.  HENT:     Head: Normocephalic and atraumatic.  Eyes:     General: Lids are normal.     Extraocular Movements: Extraocular movements intact.     Conjunctiva/sclera: Conjunctivae normal.     Pupils: Pupils are equal, round, and reactive to light.  Cardiovascular:     Rate and Rhythm: Normal rate and regular rhythm.  Pulmonary:     Effort: Pulmonary effort is normal. No respiratory distress.     Breath sounds: Normal breath sounds.  Abdominal:     General: There is no distension.     Palpations: Abdomen is soft.     Tenderness: There is no abdominal tenderness.  Skin:    General: Skin is warm and dry.  Neurological:     General: No focal deficit present.     Mental Status: She is alert.     Comments: Neuro: Speech is clear, able to follow commands. CN III-XII intact grossly intact. PERRLA. EOMI. Sensation intact throughout. Str 5/5 all extremities.     ED Results / Procedures / Treatments   Labs (all  labs ordered are listed, but only abnormal results are displayed) Labs Reviewed - No data to display  EKG None  Radiology No results found.  Procedures Procedures    Medications Ordered in ED Medications  propranolol (INDERAL) injection 1 mg (1 mg Intravenous Given 08/31/23 2252)  ketorolac (TORADOL) 30 MG/ML injection 30 mg (30 mg Intravenous Given 08/31/23 2253)    ED Course/ Medical Decision Making/ A&P                                 Medical Decision Making Risk Prescription drug management.   This patient is a 37 y.o. female  who presents to the ED for concern of headache since yesterday.   Differential diagnoses prior to evaluation: The emergent differential diagnosis includes,  but is not limited to,  Stroke, increased ICP, meningitis, CVA, intracranial tumor, venous sinus thrombosis, migraine, cluster headache, hypertension, drug related, head injury, tension headache, sinusitis, dental abscess, otitis media, TMJ, temporal arteritis, glaucoma, trigeminal neuralgia.. This is not an exhaustive differential.   Past Medical History / Co-morbidities / Social History: Depression, headaches, ADHD, hypertension, anxiety, Hashimoto's, lupus  Additional history: Chart reviewed. Pertinent results include: Reviewed most recent PCP visit from 12/6, was diagnosed with sinus infection, given steroids and amoxicillin. Reviewed medication list, she is normally on 40 mg propranolol twice daily.    Physical Exam: Physical exam performed. The pertinent findings include: Hypertensive to 142/106, otherwise normal vital signs.  No acute distress.  Normal neurologic exam as above.  Erythematous "butterfly" rash noted to the face.  Do not feel patient is requiring urgent imaging of her head with no focal deficits on exam, hx of headaches.   Medications: I ordered medication including toradol and propranolol.  I have reviewed the patients home medicines and have made adjustments as needed. Consulted with ED pharmacist as I was advised we do not carry PO propranolol, he recommended 1 mg IV. After medication, patient feels much improved and ready to go home.   Disposition: After consideration of the diagnostic results and the patients response to treatment, I feel that emergency department workup does not suggest an emergent condition requiring admission or immediate intervention beyond what has been performed at this time. The plan is: discharge to home with refill of propranolol and follow up with PCP. The patient is safe for discharge and has been instructed to return immediately for worsening symptoms, change in symptoms or any other concerns.  Final Clinical Impression(s) / ED  Diagnoses Final diagnoses:  Bad headache  History of systemic lupus erythematosus (HCC)    Rx / DC Orders ED Discharge Orders          Ordered    propranolol (INDERAL) 40 MG tablet  2 times daily        08/31/23 2328           Portions of this report may have been transcribed using voice recognition software. Every effort was made to ensure accuracy; however, inadvertent computerized transcription errors may be present.    Jeanella Flattery 08/31/23 2329    Rondel Baton, MD 09/02/23 [phone]
